# Patient Record
Sex: Male | Born: 1980 | Race: White | Hispanic: No | Marital: Single | Smoking: Current every day smoker
Health system: Southern US, Community
[De-identification: ages and names within clinical notes are randomized; demographics above are authoritative.]

## PROBLEM LIST (undated history)

## (undated) DIAGNOSIS — F1910 Other psychoactive substance abuse, uncomplicated: Secondary | ICD-10-CM

## (undated) DIAGNOSIS — I10 Essential (primary) hypertension: Secondary | ICD-10-CM

## (undated) HISTORY — PX: APPENDECTOMY: SHX54

---
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
## 2014-01-13 ENCOUNTER — Emergency Department (HOSPITAL_BASED_OUTPATIENT_CLINIC_OR_DEPARTMENT_OTHER)
Admission: EM | Admit: 2014-01-13 | Discharge: 2014-01-13 | Disposition: A | Discharge status: Home / Self Care | Payer: Self-pay | Attending: Emergency Medicine | Admitting: Emergency Medicine

## 2014-01-13 ENCOUNTER — Encounter (HOSPITAL_BASED_OUTPATIENT_CLINIC_OR_DEPARTMENT_OTHER): Payer: Self-pay | Admitting: Emergency Medicine

## 2014-01-13 ENCOUNTER — Emergency Department (HOSPITAL_BASED_OUTPATIENT_CLINIC_OR_DEPARTMENT_OTHER): Payer: Self-pay

## 2014-01-13 DIAGNOSIS — Z79899 Other long term (current) drug therapy: Secondary | ICD-10-CM | POA: Insufficient documentation

## 2014-01-13 DIAGNOSIS — S63501A Unspecified sprain of right wrist, initial encounter: Secondary | ICD-10-CM | POA: Insufficient documentation

## 2014-01-13 DIAGNOSIS — S66911A Strain of unspecified muscle, fascia and tendon at wrist and hand level, right hand, initial encounter: Secondary | ICD-10-CM

## 2014-01-13 DIAGNOSIS — T1490XA Injury, unspecified, initial encounter: Secondary | ICD-10-CM

## 2014-01-13 DIAGNOSIS — Z8781 Personal history of (healed) traumatic fracture: Secondary | ICD-10-CM | POA: Insufficient documentation

## 2014-01-13 DIAGNOSIS — I10 Essential (primary) hypertension: Secondary | ICD-10-CM | POA: Insufficient documentation

## 2014-01-13 HISTORY — DX: Essential (primary) hypertension: I10

## 2014-01-13 MED ORDER — HYDROCODONE-ACETAMINOPHEN 5-325 MG PO TABS: 1.0000 | ORAL_TABLET | ORAL | Status: AC | PRN

## 2014-01-13 MED ORDER — HYDROCODONE-ACETAMINOPHEN 5-325 MG PO TABS
1.0000 | ORAL_TABLET | Freq: Once | ORAL | Status: AC
  Administered 2014-01-13: 1 via ORAL
  Filled 2014-01-13: qty 1

## 2014-01-13 NOTE — Discharge Instructions (Signed)
Sprain °A sprain is a tear in one of the strong, fibrous tissues that connect your bones (ligaments). The severity of the sprain depends on how much of the ligament is torn. The tear can be either partial or complete. °CAUSES  °Often, sprains are a result of a fall or an injury. The force of the impact causes the fibers of your ligament to stretch beyond their normal length. This excess tension causes the fibers of your ligament to tear. °SYMPTOMS  °You may have some loss of motion or increased pain within your normal range of motion. Other symptoms include: °· Bruising. °· Tenderness. °· Swelling. °DIAGNOSIS  °In order to diagnose a sprain, your caregiver will physically examine you to determine how torn the ligament is. Your caregiver may also suggest an X-ray exam to make sure no bones are broken. °TREATMENT  °If your ligament is only partially torn, treatment usually involves keeping the injured area in a fixed position (immobilization) for a short period. To do this, your caregiver will apply a bandage, cast, or splint to keep the area from moving until it heals. For a partially torn ligament, the healing process usually takes 2 to 3 weeks. °If your ligament is completely torn, you may need surgery to reconnect the ligament to the bone or to reconstruct the ligament. After surgery, a cast or splint may be applied and will need to stay on for 4 to 6 weeks while your ligament heals. °HOME CARE INSTRUCTIONS °· Keep the injured area elevated to decrease swelling. °· To ease pain and swelling, apply ice to your joint twice a day, for 2 to 3 days. °¨ Put ice in a plastic bag. °¨ Place a towel between your skin and the bag. °¨ Leave the ice on for 15 minutes. °· Only take over-the-counter or prescription medicine for pain as directed by your caregiver. °· Do not leave the injured area unprotected until pain and stiffness go away (usually 3 to 4 weeks). °· Do not allow your cast or splint to get wet. Cover your cast or  splint with a plastic bag when you shower or bathe. Do not swim. °· Your caregiver may suggest exercises for you to do during your recovery to prevent or limit permanent stiffness. °SEEK IMMEDIATE MEDICAL CARE IF: °· Your cast or splint becomes damaged. °· Your pain becomes worse. °MAKE SURE YOU: °· Understand these instructions. °· Will watch your condition. °· Will get help right away if you are not doing well or get worse. °Document Released: 02/24/2000 Document Revised: 05/21/2011 Document Reviewed: 03/10/2011 °ExitCare® Patient Information ©2015 [employer]. This information is not intended to replace advice given to you by your health care provider. Make sure you discuss any questions you have with your health care provider. ° °
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
## 2014-01-13 NOTE — ED Notes (Signed)
NP at bedside.

## 2014-02-06 ENCOUNTER — Emergency Department (HOSPITAL_COMMUNITY)
Admission: EM | Admit: 2014-02-06 | Discharge: 2014-02-06 | Disposition: A | Discharge status: Home / Self Care | Payer: Self-pay | Attending: Emergency Medicine | Admitting: Emergency Medicine

## 2014-02-06 ENCOUNTER — Encounter (HOSPITAL_COMMUNITY): Payer: Self-pay | Admitting: *Deleted

## 2014-02-06 DIAGNOSIS — I480 Paroxysmal atrial fibrillation: Secondary | ICD-10-CM | POA: Insufficient documentation

## 2014-02-06 DIAGNOSIS — Y998 Other external cause status: Secondary | ICD-10-CM | POA: Insufficient documentation

## 2014-02-06 DIAGNOSIS — Z79899 Other long term (current) drug therapy: Secondary | ICD-10-CM | POA: Insufficient documentation

## 2014-02-06 DIAGNOSIS — Z720 Tobacco use: Secondary | ICD-10-CM | POA: Insufficient documentation

## 2014-02-06 DIAGNOSIS — T401X1A Poisoning by heroin, accidental (unintentional), initial encounter: Secondary | ICD-10-CM | POA: Insufficient documentation

## 2014-02-06 DIAGNOSIS — Y9389 Activity, other specified: Secondary | ICD-10-CM | POA: Insufficient documentation

## 2014-02-06 DIAGNOSIS — Y9289 Other specified places as the place of occurrence of the external cause: Secondary | ICD-10-CM | POA: Insufficient documentation

## 2014-02-06 DIAGNOSIS — I10 Essential (primary) hypertension: Secondary | ICD-10-CM | POA: Insufficient documentation

## 2014-02-06 HISTORY — DX: Other psychoactive substance abuse, uncomplicated: F19.10

## 2014-02-06 LAB — CBC
HEMATOCRIT: 45.8 % (ref 39.0–52.0)
Hemoglobin: 15.7 g/dL (ref 13.0–17.0)
MCH: 31.3 pg (ref 26.0–34.0)
MCHC: 34.3 g/dL (ref 30.0–36.0)
MCV: 91.2 fL (ref 78.0–100.0)
Platelets: 204 10*3/uL (ref 150–400)
RBC: 5.02 MIL/uL (ref 4.22–5.81)
RDW: 14.1 % (ref 11.5–15.5)
WBC: 13.2 10*3/uL — AB (ref 4.0–10.5)

## 2014-02-06 LAB — COMPREHENSIVE METABOLIC PANEL
ALK PHOS: 55 U/L (ref 39–117)
ALT: 28 U/L (ref 0–53)
AST: 25 U/L (ref 0–37)
Albumin: 3.7 g/dL (ref 3.5–5.2)
Anion gap: 13 (ref 5–15)
BUN: 10 mg/dL (ref 6–23)
CHLORIDE: 101 meq/L (ref 96–112)
CO2: 28 mEq/L (ref 19–32)
CREATININE: 0.8 mg/dL (ref 0.50–1.35)
Calcium: 9.1 mg/dL (ref 8.4–10.5)
GFR calc Af Amer: >90 mL/min (ref >90)
GFR calc non Af Amer: >90 mL/min (ref >90)
Glucose, Bld: 142 mg/dL — ABNORMAL HIGH (ref 70–99)
Potassium: 3.8 mEq/L (ref 3.7–5.3)
Sodium: 142 mEq/L (ref 137–147)
Total Bilirubin: 1.2 mg/dL (ref 0.3–1.2)
Total Protein: 7.2 g/dL (ref 6.0–8.3)

## 2014-02-06 LAB — SALICYLATE LEVEL: Salicylate Lvl: <2 mg/dL — ABNORMAL LOW (ref 2.8–20.0)

## 2014-02-06 LAB — TROPONIN I: Troponin I: <0.3 ng/mL (ref <0.30)

## 2014-02-06 LAB — ETHANOL

## 2014-02-06 LAB — ACETAMINOPHEN LEVEL: Acetaminophen (Tylenol), Serum: <15 ug/mL (ref 10–30)

## 2014-02-06 MED ORDER — SODIUM CHLORIDE 0.9 % IV SOLN
Freq: Once | INTRAVENOUS | Status: AC
  Administered 2014-02-06: 16:00:00 via INTRAVENOUS

## 2014-02-06 MED ORDER — DILTIAZEM HCL 100 MG IV SOLR
5.0000 mg/h | INTRAVENOUS | Status: DC
  Filled 2014-02-06: qty 100

## 2014-02-06 MED ORDER — DILTIAZEM LOAD VIA INFUSION: 15.0000 mg | Freq: Once | INTRAVENOUS | Status: DC

## 2014-02-06 NOTE — Discharge Instructions (Signed)
Atrial Fibrillation Atrial fibrillation is a condition that causes your heart to beat irregularly. It may also cause your heart to beat faster than normal. Atrial fibrillation can prevent your heart from pumping blood normally. It increases your risk of stroke and heart problems. HOME CARE  Take medications as told by your doctor.  Only take medications that your doctor says are safe. Some medications can make the condition worse or happen again.  If blood thinners were prescribed by your doctor, take them exactly as told. Too much can cause bleeding. Too little and you will not have the needed protection against stroke and other problems.  Perform blood tests at home if told by your doctor.  Perform blood tests exactly as told by your doctor.  Do not drink alcohol.  Do not drink beverages with caffeine such as coffee, soda, and some teas.  Maintain a healthy weight.  Do not use diet pills unless your doctor says they are safe. They may make heart problems worse.  Follow diet instructions as told by your doctor.  Exercise regularly as told by your doctor.  Keep all follow-up appointments. GET HELP IF:  You notice a change in the speed, rhythm, or strength of your heartbeat.  You suddenly begin peeing (urinating) more often.  You get tired more easily when moving or exercising. GET HELP RIGHT AWAY IF:   You have chest or belly (abdominal) pain.  You feel sick to your stomach (nauseous).  You are short of breath.  You suddenly have swollen feet and ankles.  You feel dizzy.  You face, arms, or legs feel numb or weak.  There is a change in your vision or speech. MAKE SURE YOU: Opioid Withdrawal Opioids are a group of narcotic drugs. They include the street drug heroin. They also include pain medicines, such as morphine, hydrocodone, oxycodone, and fentanyl. Opioid withdrawal is a group of characteristic physical and mental signs and symptoms. It typically occurs if you  have been using opioids daily for several weeks or longer and stop using or rapidly decrease use. Opioid withdrawal can also occur if you have used opioids daily for a long time and are given a medicine to block the effect.  SIGNS AND SYMPTOMS Opioid withdrawal includes three or more of the following symptoms:   Depressed, anxious, or irritable mood.  Nausea or vomiting.  Muscle aches or spasms.   Watery eyes.   Runny nose.  Dilated pupils, sweating, or hairs standing on end.  Diarrhea or intestinal cramping.  Yawning.   Fever.  Increased blood pressure.  Fast pulse.  Restlessness or trouble sleeping. These signs and symptoms occur within several hours of stopping or reducing short-acting opioids, such as heroin. They can occur within 3 days of stopping or reducing long-acting opioids, such as methadone. Withdrawal begins within minutes of receiving a drug that blocks the effects of opioids, such as naltrexone or naloxone. DIAGNOSIS  Opioid use disorder is diagnosed by your health care provider. You will be asked about your symptoms, drug and alcohol use, medical history, and use of medicines. A physical exam may be done. Lab tests may be ordered. Your health care provider may have you see a mental health professional.  TREATMENT  The treatment for opioid withdrawal is usually provided by medical doctors with special training in substance use disorders (addiction specialists). The following medicines may be included in treatment:  Opioids given in place of the abused opioid. They turn on opioid receptors in the brain and  lessen or prevent withdrawal symptoms. They are gradually decreased (opioid substitution and taper).  Non-opioids that can lessen certain opioid withdrawal symptoms. They may be used alone or with opioid substitution and taper. Successful long-term recovery usually requires medicine, counseling, and group support. HOME CARE INSTRUCTIONS   Take medicines  only as directed by your health care provider.  Check with your health care provider before starting new medicines.  Keep all follow-up visits as directed by your health care provider. SEEK MEDICAL CARE IF:  You are not able to take your medicines as directed.  Your symptoms get worse.  You relapse. SEEK IMMEDIATE MEDICAL CARE IF:  You have serious thoughts about hurting yourself or others.  You have a seizure.  You lose consciousness. Document Released: 03/01/2003 Document Revised: 07/13/2013 Document Reviewed: 03/11/2013 [facility] Patient Information 2015 [place], [place]. This information is not intended to replace advice given to you by your health care provider. Make sure you discuss any questions you have with your health care provider.   Understand these instructions.  Will watch your condition.  Will get help right away if you are not doing well or get worse. Document Released: 12/06/2007 Document Revised: 07/13/2013 Document Reviewed: 04/08/2012 [facility] Patient Information 2015 [place], [place]. This information is not intended to replace advice given to you by your health care provider. Make sure you discuss any questions you have with your health care provider.

## 2014-02-06 NOTE — ED Notes (Signed)
Pt attempted to provide urine sample however was unsuccessful.
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
## 2014-02-06 NOTE — ED Notes (Signed)
[facility], apneic, cyanotic, upon arrival, Narcan 2 mg IV given, IV #20 LH, aroused athen alert and oriented since Narcan, Admits to Heroin use, only drug used.

## 2014-02-06 NOTE — ED Notes (Signed)
Pt ambulated to restroom with steady gait. Pt going to attempt to provide us with a urine sample.

## 2014-02-06 NOTE — ED Notes (Signed)
Pt talking on phone
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
## 2014-02-06 NOTE — ED Notes (Signed)
Pt unable to provide a urine sample. Water provided.
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
## 2016-09-09 DEATH — deceased
